# Patient Record
Sex: Female | Born: 1960 | Race: Black or African American | Hispanic: No | Marital: Single | Smoking: Current every day smoker
Health system: Southern US, Community
[De-identification: ages and names within clinical notes are randomized; demographics above are authoritative.]

## PROBLEM LIST (undated history)

## (undated) DIAGNOSIS — E119 Type 2 diabetes mellitus without complications: Secondary | ICD-10-CM

## (undated) HISTORY — DX: Type 2 diabetes mellitus without complications: E11.9

---

## 2004-02-29 ENCOUNTER — Ambulatory Visit: Payer: Self-pay | Admitting: Family Medicine

## 2004-03-15 ENCOUNTER — Ambulatory Visit: Payer: Self-pay | Admitting: Family Medicine

## 2004-04-15 ENCOUNTER — Ambulatory Visit: Payer: Self-pay | Admitting: Family Medicine

## 2004-05-16 ENCOUNTER — Ambulatory Visit: Payer: Self-pay | Admitting: Family Medicine

## 2006-11-12 ENCOUNTER — Ambulatory Visit: Payer: Self-pay | Admitting: Family Medicine

## 2006-11-14 ENCOUNTER — Ambulatory Visit: Payer: Self-pay | Admitting: Family Medicine

## 2012-09-15 ENCOUNTER — Emergency Department: Payer: Self-pay | Admitting: Emergency Medicine

## 2013-03-05 ENCOUNTER — Ambulatory Visit: Payer: Self-pay | Admitting: Family Medicine

## 2013-03-06 ENCOUNTER — Emergency Department: Payer: Self-pay | Admitting: Emergency Medicine

## 2013-06-15 ENCOUNTER — Ambulatory Visit: Payer: Self-pay | Admitting: Family Medicine

## 2013-09-29 ENCOUNTER — Observation Stay: Payer: Self-pay | Admitting: Internal Medicine

## 2013-09-29 LAB — CBC WITH DIFFERENTIAL/PLATELET
Basophil #: 0.1 10*3/uL (ref 0.0–0.1)
Basophil %: 0.9 %
EOS ABS: 0.1 10*3/uL (ref 0.0–0.7)
Eosinophil %: 0.9 %
HCT: 36.7 % (ref 35.0–47.0)
HGB: 12.1 g/dL (ref 12.0–16.0)
Lymphocyte #: 3.8 10*3/uL — ABNORMAL HIGH (ref 1.0–3.6)
Lymphocyte %: 26.7 %
MCH: 29 pg (ref 26.0–34.0)
MCHC: 32.9 g/dL (ref 32.0–36.0)
MCV: 88 fL (ref 80–100)
MONO ABS: 0.6 x10 3/mm (ref 0.2–0.9)
Monocyte %: 4.3 %
NEUTROS PCT: 67.2 %
Neutrophil #: 9.5 10*3/uL — ABNORMAL HIGH (ref 1.4–6.5)
Platelet: 237 10*3/uL (ref 150–440)
RBC: 4.16 10*6/uL (ref 3.80–5.20)
RDW: 14.2 % (ref 11.5–14.5)
WBC: 14.2 10*3/uL — ABNORMAL HIGH (ref 3.6–11.0)

## 2013-09-29 LAB — URINALYSIS, COMPLETE
Bilirubin,UR: NEGATIVE
Blood: NEGATIVE
GLUCOSE, UR: NEGATIVE mg/dL (ref 0–75)
Ketone: NEGATIVE
LEUKOCYTE ESTERASE: NEGATIVE
Nitrite: NEGATIVE
PROTEIN: NEGATIVE
Ph: 5 (ref 4.5–8.0)
SPECIFIC GRAVITY: 1.014 (ref 1.003–1.030)
Squamous Epithelial: 4

## 2013-09-29 LAB — LIPASE, BLOOD: Lipase: 160 U/L (ref 73–393)

## 2013-09-29 LAB — MAGNESIUM: Magnesium: 2 mg/dL

## 2013-09-29 LAB — COMPREHENSIVE METABOLIC PANEL
ALT: 51 U/L (ref 12–78)
ANION GAP: 6 — AB (ref 7–16)
Albumin: 3.6 g/dL (ref 3.4–5.0)
Alkaline Phosphatase: 87 U/L
BUN: 13 mg/dL (ref 7–18)
Bilirubin,Total: 0.1 mg/dL — ABNORMAL LOW (ref 0.2–1.0)
CALCIUM: 8.3 mg/dL — AB (ref 8.5–10.1)
Chloride: 107 mmol/L (ref 98–107)
Co2: 28 mmol/L (ref 21–32)
Creatinine: 0.48 mg/dL — ABNORMAL LOW (ref 0.60–1.30)
EGFR (African American): >60
Glucose: 140 mg/dL — ABNORMAL HIGH (ref 65–99)
Osmolality: 284 (ref 275–301)
Potassium: 2.9 mmol/L — ABNORMAL LOW (ref 3.5–5.1)
SGOT(AST): 36 U/L (ref 15–37)
Sodium: 141 mmol/L (ref 136–145)
Total Protein: 7.4 g/dL (ref 6.4–8.2)

## 2013-09-29 LAB — CK: CK, Total: 130 U/L

## 2013-09-29 LAB — TROPONIN I
Troponin-I: <0.02 ng/mL
Troponin-I: <0.02 ng/mL

## 2013-09-30 LAB — CBC WITH DIFFERENTIAL/PLATELET
BASOS ABS: 0.1 10*3/uL (ref 0.0–0.1)
Basophil %: 0.5 %
EOS ABS: 0 10*3/uL (ref 0.0–0.7)
Eosinophil %: 0.2 %
HCT: 34 % — AB (ref 35.0–47.0)
HGB: 11.2 g/dL — ABNORMAL LOW (ref 12.0–16.0)
LYMPHS PCT: 18.9 %
Lymphocyte #: 2.2 10*3/uL (ref 1.0–3.6)
MCH: 29 pg (ref 26.0–34.0)
MCHC: 32.9 g/dL (ref 32.0–36.0)
MCV: 88 fL (ref 80–100)
MONO ABS: 0.6 x10 3/mm (ref 0.2–0.9)
Monocyte %: 5.1 %
NEUTROS ABS: 9 10*3/uL — AB (ref 1.4–6.5)
Neutrophil %: 75.3 %
PLATELETS: 212 10*3/uL (ref 150–440)
RBC: 3.86 10*6/uL (ref 3.80–5.20)
RDW: 14.1 % (ref 11.5–14.5)
WBC: 11.9 10*3/uL — AB (ref 3.6–11.0)

## 2013-09-30 LAB — BASIC METABOLIC PANEL
Anion Gap: 6 — ABNORMAL LOW (ref 7–16)
BUN: 7 mg/dL (ref 7–18)
CALCIUM: 8.4 mg/dL — AB (ref 8.5–10.1)
CREATININE: 0.48 mg/dL — AB (ref 0.60–1.30)
Chloride: 106 mmol/L (ref 98–107)
Co2: 28 mmol/L (ref 21–32)
EGFR (Non-African Amer.): >60
Glucose: 112 mg/dL — ABNORMAL HIGH (ref 65–99)
OSMOLALITY: 278 (ref 275–301)
POTASSIUM: 3.5 mmol/L (ref 3.5–5.1)
Sodium: 140 mmol/L (ref 136–145)

## 2014-05-03 ENCOUNTER — Ambulatory Visit: Payer: Self-pay | Admitting: Physician Assistant

## 2014-06-13 IMAGING — CR SACRUM AND COCCYX - 2+ VIEW
1 series · 3 of 3 positions shown · non-contrast
Comparison: none

REASON FOR EXAM: fall, landed on rear, pain
COMMENTS:

PROCEDURE:     DXR - DXR SACRUM AND COCCYX  - September 15, 2012 [DATE]
RESULT:     AP lateral views of the sacrum reveal the bones to be adequately
mineralized. I do not see evidence of an acute fracture nor dislocation. The
SI joints are grossly normal. The coccyx appears intact.

[Series 1: t sacrum ap · 0.14mm/px · 3 of 3 slices shown]
[im 1/3]
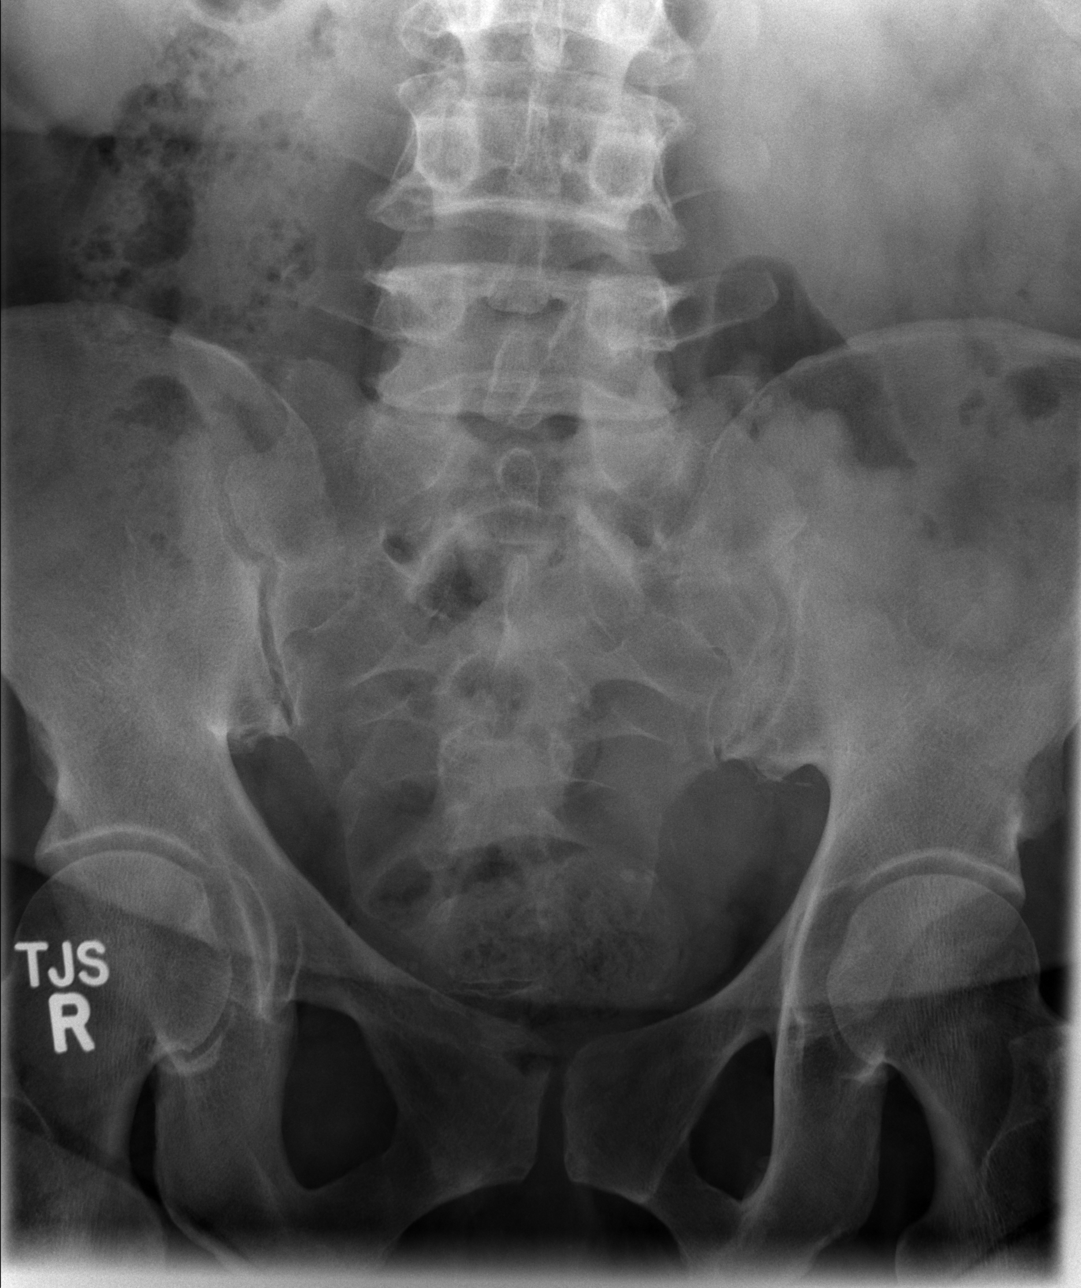
[im 2/3]
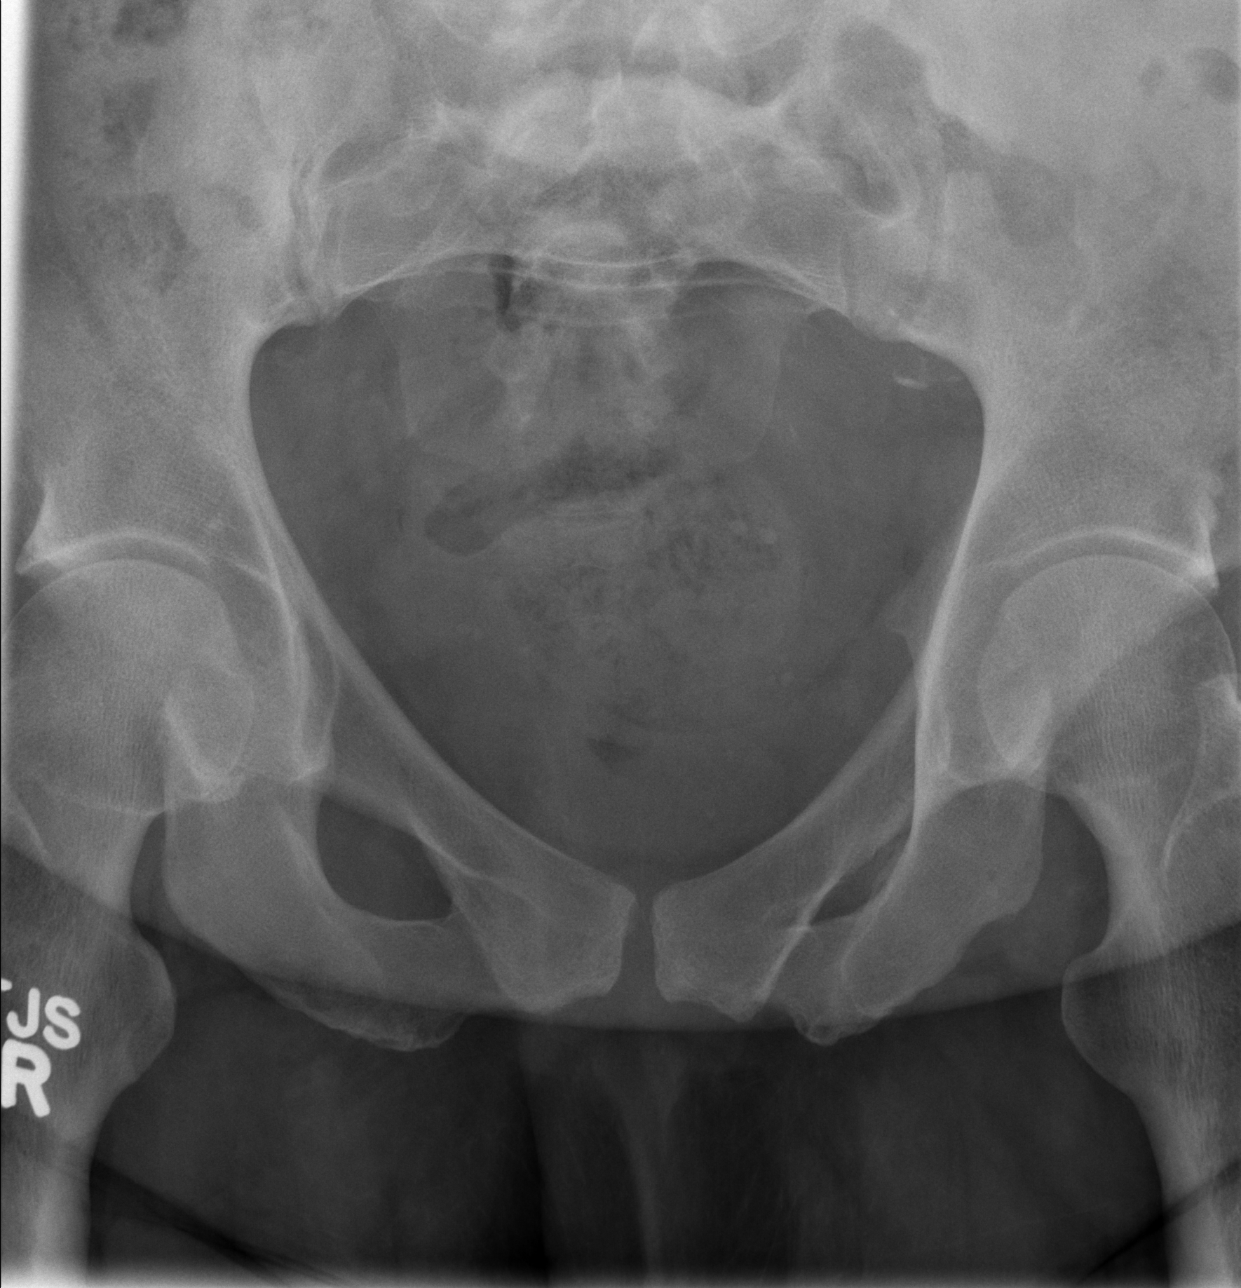
[im 3/3]
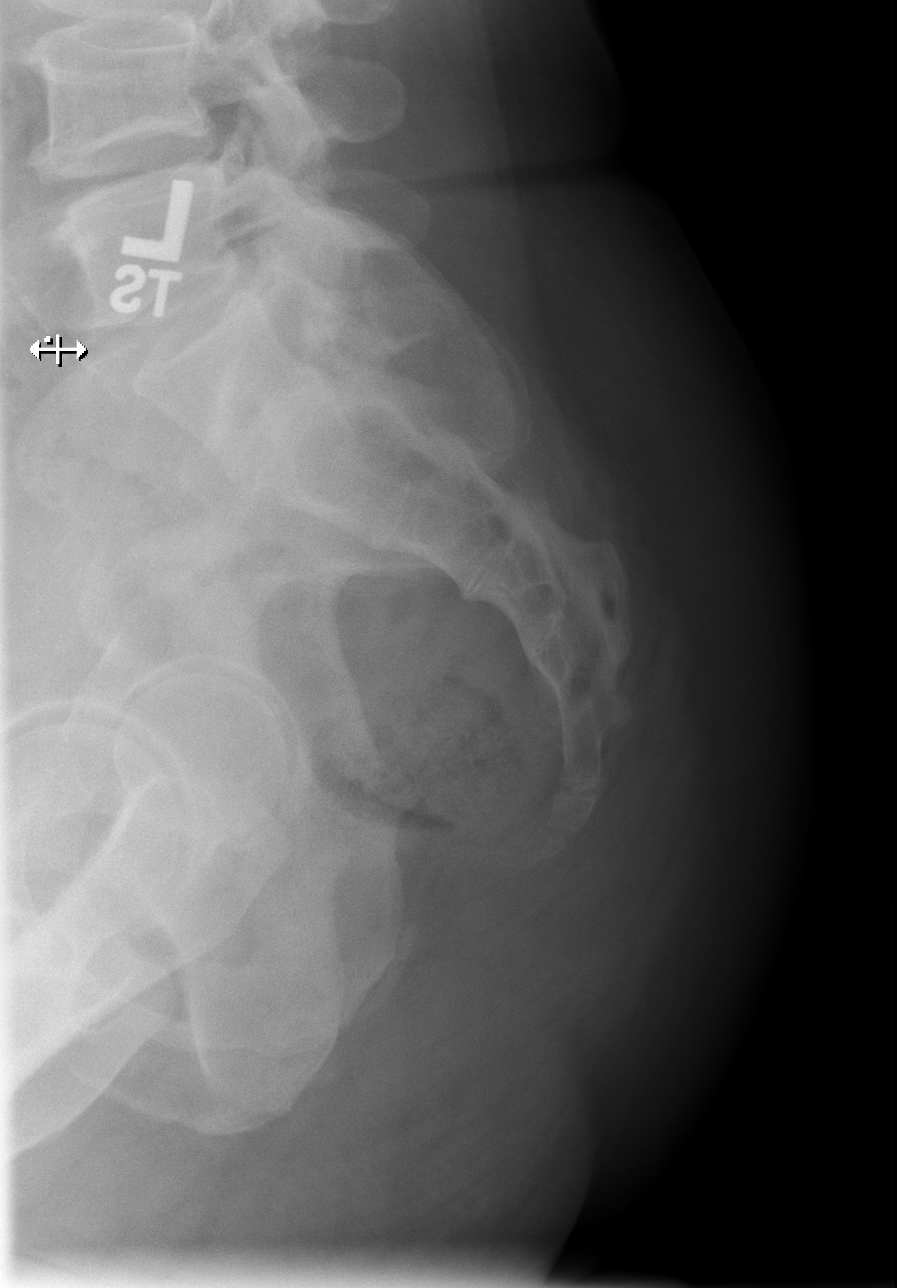

[3 of 3 positions shown; findings below may reference images not displayed]

IMPRESSION: I do not see acute bony abnormality of the sacrum or
coccyx. Followup CT scanning or MRI are available if the patient's symptoms
do not resolve in a fashion consistent with an uncomplicated sprain or
contusion.

[REDACTED]

## 2014-08-06 NOTE — Discharge Summary (Signed)
PATIENT NAME:  Kristina Kristina [name], Kristina Kristina [name] MR#:  [id number] DATE OF BIRTH:  [birth year]  DATE OF ADMISSION:  09/29/2013 DATE OF DISCHARGE:  09/30/2013  PRIMARY CARE PHYSICIAN: Nonlocal   DISCHARGE DIAGNOSES: 1. Acute gastritis.  2. Hypokalemia.  3. Hypertension.  4. Diabetes.  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   CODE STATUS: Full code.   HOME MEDICATION: Please refer to the medication reconciliation list.   DIET: Low sodium, low fat, low cholesterol, ADA diet.   ACTIVITY: As tolerated.   FOLLOWUP CARE: Follow up with PCP within 1-2 weeks. Follow up with PCP for incidental adrenal mass.   REASON FOR ADMISSION: Abdominal pain.   HOSPITAL COURSE: The patient is Kristina [name] 54 year old African American female with Kristina [name] history of hypertension, diabetes, hypothyroidism, presented to the ED with abdominal pain, nausea, vomiting but no diarrhea. For detailed history and physical examination, please refer to the admission note dictated by Dr. Clint GuyHower. Laboratory data on admission date showed sodium 141, potassium 2.9, chloride 107, bicarbonate 28, BUN 13, creatinine 0.48, glucose 140, magnesium 2, lipase 160, WBC 14.2. CAT scan of her abdomen and pelvis show adrenal mass of 2.3 cm x 2 cm.  1. Intractable nausea, vomiting, which is possibly due to acute gastritis. After admission, the patient was placed on clear liquids with Zofran and the Phenergan p.r.n. The patient's abdominal pain, nausea, vomiting have much improved.  She has no complaints this morning.   2. Hypokalemia. The patient was replaced with potassium level increased to 3.5 today and I gave another dose of potassium 30 mEq this morning.  3. Leukocytosis, possibly due to reaction to gastritis, decreased to 12 this morning.  4. Hypertension is under control.  5. The patient has no complaints. Vital signs stable. She is clinically stable and will be discharged to home today. I discussed the patient's discharge plan with the  patient, nurse, case manager.   TIME SPENT: About 35 minutes.    ____________________________ Shaune PollackQing Chen, MD qc:dd D: 09/30/2013 15:09:20 ET T: 09/30/2013 20:56:33 ET JOB#: [id number]  cc: Shaune PollackQing Chen, MD, <Dictator> Shaune PollackQING CHEN MD ELECTRONICALLY SIGNED 10/01/2013 17:12

## 2014-08-06 NOTE — H&P (Signed)
PATIENT NAME:  Kristina [name], Kristina [name] MR#:  [id number] DATE OF BIRTH:  19-Jul-1960  DATE OF ADMISSION:  09/29/2013  REFERRING PHYSICIAN: Margarita Grizzle.   PRIMARY CARE PHYSICIAN: [facility].   CHIEF COMPLAINT: Abdominal pain.   HISTORY OF PRESENT ILLNESS: A 54 year old African American female with past medical history of hypertension, diabetes, hypothyroidism, presenting with abdominal pain. Describes acute onset abdominal pain, epigastric in location. Described only as "pain." Intensity 8 out of 10, nonradiating. No worsening or relieving factors. Apparently, her symptoms started after eating "red hot wings which I'm not supposed to eat." She has associated nausea and vomiting. Had multiple bouts, approximately 5 to 6 thus far, of nonbloody, nonbilious emesis. Her symptoms persisted in the ER despite receiving does of Zofran and Phenergan, and she received some IV fluid hydration.   REVIEW OF SYSTEMS:   CONSTITUTIONAL: Denies fever, fatigue, weakness.  EYES: Denies blurred vision, double vision, eye pain.  EARS, NOSE, THROAT: Denies tinnitus, ear pain, hearing loss.  RESPIRATORY: Denies cough, wheeze, shortness of breath.  CARDIOVASCULAR: Denies chest pain, palpitations, edema.  GASTROINTESTINAL: Positive for nausea, vomiting, abdominal pain as described above. Denies any diarrhea, hematemesis or melena.  GENITOURINARY: Denies dysuria or hematuria.  ENDOCRINE: Denies nocturia or thyroid problems.  HEMATOLOGY AND LYMPHATIC: Denies easy bruising, bleeding.  SKIN: Denies rashes or lesions.  MUSCULOSKELETAL: Denies pain in neck, back, shoulder, knees, hips or arthritic symptoms.  NEUROLOGIC: Denies paralysis, paresthesias.  PSYCHIATRIC: Denies anxiety or depressive symptoms.   Otherwise, full review of systems performed by me is negative.   PAST MEDICAL HISTORY: Hypertension, diabetes, as well as hypothyroidism.   SOCIAL HISTORY: Positive for every day tobacco use. Occasional alcohol usage.  Denies any drug usage.   FAMILY HISTORY: Positive for diabetes.   ALLERGIES: No known drug allergies.   HOME MEDICATIONS: Include meloxicam 7.5 mg p.o. b.i.d. as needed for pain, benazepril 40 mg p.o. daily, citalopram 10 mg p.o. daily, metformin 500 mg 3 tablets extended release p.o. daily, cetirizine 10 mg p.o. daily, simvastatin 20 mg p.o. at bedtime, Norvasc 5 mg p.o. daily, hydrochlorothiazide 25 mg p.o. daily, Prilosec 20 mg p.o. daily.   PHYSICAL EXAMINATION:  VITAL SIGNS: Temperature 97.7, heart rate 66, respirations 18, blood pressure 173/77, saturating 96% on room air. Weight 104.3 kg, BMI 36.1.  GENERAL: Well-nourished, well-developed, African American female, currently in no acute distress.  HEAD: Normocephalic, atraumatic.  EYES: Pupils equal, round and reactive to light. Extraocular muscles intact. No scleral icterus.  MOUTH: Moist mucosal membranes. Dentition intact. No abscess noted.  EARS, NOSE, THROAT: Clear without exudates. No external lesions.  NECK: Supple. No thyromegaly. No nodules. No JVD.  PULMONARY: Clear to auscultation bilaterally without wheezes, rubs or rhonchi. No use of accessory muscles. Good respiratory effort.  CHEST: Nontender to palpation.  CARDIOVASCULAR: S1, S2, regular rate and rhythm. No murmurs, rubs or gallops. Trace edema to ankles bilaterally. Peripheral pulses 2+ bilaterally.  GASTROINTESTINAL: Soft, obese, minimal tenderness to epigastric/periumbilical region without rebound or guarding or motion tenderness. Positive bowel sounds. No hepatosplenomegaly.  MUSCULOSKELETAL: No swelling, clubbing or edema other than described above. Range of motion full in all extremities.  NEUROLOGIC: Cranial nerves II through XII intact. No gross focal neurological deficits. Sensation intact. Reflexes intact.  SKIN: No ulceration, lesions, rash or cyanosis. Skin warm, dry. Turgor intact.  PSYCHIATRIC: Mood and affect within normal limits. The patient is awake,  alert, oriented x 3. Insight and judgment intact.   LABORATORY DATA: Sodium 141, potassium 2.9, chloride 107,  bicarb 28, BUN 13, creatinine 0.48, glucose 140. Magnesium of 2. Lipase 160. LFTs within normal limits. Troponin I less than 0.02. WBC 14.2, hemoglobin 12.1, platelets of 237. Urinalysis negative for evidence of infection. CT abdomen performed revealing a 2.3 x 2 cm mild enhancing mass probably extending from anterior/inferior left adrenal gland. No acute abdominal findings. EKG performed. Normal sinus rhythm; however, there are anterolateral T inversions without prior EKGs.   ASSESSMENT AND PLAN: A 54 year old [place] female with a history of hypertension, diabetes, presenting with abdominal pain after eating red hot wings, followed by intractable nausea and vomiting.  1. Intractable nausea and vomiting: Provide supportive care including Zofran and/or Phenergan if required for nausea. Intravenous fluid hydration. Once again, no acute findings on abdominal or pelvic CT.  2. Hypokalemia: Replace potassium to goal of 4 to 5.  3. Diabetes type 2: Will hold p.o. agents. Add insulin sliding scale with q.6 hour Accu-Cheks.  4. Leukocytosis: No evidence of infection at this time. No need for antibiotics.  5. Abnormal EKG with unknown baseline: Will trend cardiac enzymes x 3.  6. Hypertension: Hold hydrochlorothiazide. Continue her other medications including ACE inhibitors and Norvasc.  7. 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  8. Venous thromboembolism prophylaxis with heparin subcutaneous.   The patient is FULL CODE.   TIME SPENT: 45 minutes.   ____________________________ Cletis Athensavid K. Hower, MD dkh:gb D: 09/29/2013 21:42:34 ET T: 09/29/2013 22:20:53 ET JOB#: [id number]  cc: Cletis Athensavid K. Hower, MD, <Dictator> DAVID Synetta ShadowK HOWER MD ELECTRONICALLY SIGNED 09/30/2013 20:25

## 2017-09-29 ENCOUNTER — Other Ambulatory Visit: Payer: Self-pay | Admitting: Family Medicine

## 2017-09-29 DIAGNOSIS — Z1231 Encounter for screening mammogram for malignant neoplasm of breast: Secondary | ICD-10-CM

## 2018-04-28 ENCOUNTER — Other Ambulatory Visit: Payer: Self-pay | Admitting: Family Medicine

## 2018-04-28 DIAGNOSIS — Z1231 Encounter for screening mammogram for malignant neoplasm of breast: Secondary | ICD-10-CM

## 2022-05-06 ENCOUNTER — Other Ambulatory Visit: Payer: Self-pay | Admitting: Family Medicine

## 2022-05-06 DIAGNOSIS — Z1231 Encounter for screening mammogram for malignant neoplasm of breast: Secondary | ICD-10-CM

## 2022-05-24 ENCOUNTER — Ambulatory Visit: Payer: Medicaid Other | Admitting: Podiatry

## 2022-05-24 ENCOUNTER — Encounter: Payer: Self-pay | Admitting: Podiatry

## 2022-05-24 VITALS — BP 143/67 | HR 67

## 2022-05-24 DIAGNOSIS — M79675 Pain in left toe(s): Secondary | ICD-10-CM | POA: Diagnosis not present

## 2022-05-24 DIAGNOSIS — B351 Tinea unguium: Secondary | ICD-10-CM

## 2022-05-24 DIAGNOSIS — M79674 Pain in right toe(s): Secondary | ICD-10-CM | POA: Diagnosis not present

## 2022-05-24 NOTE — Progress Notes (Signed)
  Subjective:  Patient ID: Kristina [name], female    DOB: [birth year],  MRN: [id number]  Chief Complaint  Patient presents with   Nail Problem    "Clip my toenails.  They're painful.  I'm Diabetic. They're thick and hard to cut.  It's been about 2 years since I had it done.  The last time I had it done, the man cut my meat."   62 y.o. female returns for the above complaint.  Patient presents with thickened elongated dystrophic toenails x 10 she states painful to touch is progressive gotten worse she is a diabetic she would like to me to debride down she is not able to do it herself.  Objective:   Vitals:   05/24/22 1006  BP: (!) 143/67  Pulse: 67   Podiatric Exam: Vascular: dorsalis pedis and posterior tibial pulses are palpable bilateral. Capillary return is immediate. Temperature gradient is WNL. Skin turgor WNL  Sensorium: Normal Semmes Weinstein monofilament test. Normal tactile sensation bilaterally. Nail Exam: Pt has thick disfigured discolored nails with subungual debris noted bilateral entire nail hallux through fifth toenails.  Pain on palpation to the nails. Ulcer Exam: There is no evidence of ulcer or pre-ulcerative changes or infection. Orthopedic Exam: Muscle tone and strength are WNL. No limitations in general ROM. No crepitus or effusions noted.  Skin: No Porokeratosis. No infection or ulcers    Assessment & Plan:   1. Pain due to onychomycosis of toenails of both feet     Patient was evaluated and treated and all questions answered.  Onychomycosis with pain  -Nails palliatively debrided as below. -Educated on self-care  Procedure: Nail Debridement Rationale: pain  Type of Debridement: manual, sharp debridement. Instrumentation: Nail nipper, rotary burr. Number of Nails: 10  Procedures and Treatment: Consent by patient was obtained for treatment procedures. The patient understood the discussion of treatment and procedures well. All questions were answered  thoroughly reviewed. Debridement of mycotic and hypertrophic toenails, 1 through 5 bilateral and clearing of subungual debris. No ulceration, no infection noted.  Return Visit-Office Procedure: Patient instructed to return to the office for a follow up visit 3 months for continued evaluation and treatment.  Boneta Lucks, DPM    No follow-ups on file.

## 2022-06-28 ENCOUNTER — Ambulatory Visit
Admission: RE | Admit: 2022-06-28 | Discharge: 2022-06-28 | Disposition: A | Discharge status: Home / Self Care | Payer: Medicaid Other | Source: Ambulatory Visit | Attending: Family Medicine | Admitting: Family Medicine

## 2022-06-28 DIAGNOSIS — Z1231 Encounter for screening mammogram for malignant neoplasm of breast: Secondary | ICD-10-CM | POA: Diagnosis present

## 2022-09-05 ENCOUNTER — Encounter: Payer: Self-pay | Admitting: Podiatry

## 2022-09-05 ENCOUNTER — Ambulatory Visit: Payer: Medicaid Other | Admitting: Podiatry

## 2022-09-05 VITALS — BP 122/64

## 2022-09-05 DIAGNOSIS — M2011 Hallux valgus (acquired), right foot: Secondary | ICD-10-CM

## 2022-09-05 DIAGNOSIS — E119 Type 2 diabetes mellitus without complications: Secondary | ICD-10-CM | POA: Diagnosis not present

## 2022-09-05 DIAGNOSIS — B351 Tinea unguium: Secondary | ICD-10-CM

## 2022-09-05 DIAGNOSIS — E1142 Type 2 diabetes mellitus with diabetic polyneuropathy: Secondary | ICD-10-CM | POA: Diagnosis not present

## 2022-09-05 DIAGNOSIS — M2141 Flat foot [pes planus] (acquired), right foot: Secondary | ICD-10-CM

## 2022-09-05 DIAGNOSIS — M79674 Pain in right toe(s): Secondary | ICD-10-CM

## 2022-09-05 DIAGNOSIS — L84 Corns and callosities: Secondary | ICD-10-CM

## 2022-09-05 DIAGNOSIS — M2012 Hallux valgus (acquired), left foot: Secondary | ICD-10-CM

## 2022-09-05 DIAGNOSIS — M79675 Pain in left toe(s): Secondary | ICD-10-CM

## 2022-09-05 DIAGNOSIS — M2142 Flat foot [pes planus] (acquired), left foot: Secondary | ICD-10-CM

## 2022-09-05 NOTE — Progress Notes (Signed)
ANNUAL DIABETIC FOOT EXAM  Subjective: Kristina [name] presents today annual diabetic foot exam. She is accompanied by her first cousin on today's visit. Patient is nervous about experiencing pain during her visit today.  States left great toenail is thick and tender. She has h/o of attempted trimming of nails and states all of her toes bled when she cut them, so she has refrained from doing it again. Per chart review, she has h/o toe ulcer of right 2nd digit. Chief Complaint  Patient presents with   Nail Problem    RFC,Referring Provider Drew,Charles, MD,LOV:3-4 months ago,B/S: no finger sticks,A1C:      Chart review reveals h/o toe ulcer right 2nd toe.  Patient denies any numbness, tingling, burning, or pins/needle sensation in feet.  Risk factors: diabetes, history of foot/leg ulcer, HTN, hyperlipidemia, current tobacco user.  Mickel Fuchs, MD is patient's PCP.  Past Medical History:  Diagnosis Date   Diabetes [facility])    Patient Active Problem List   Diagnosis Date Noted   Diabetes (HCC) 09/05/2022   No past surgical history on file. Current Outpatient Medications on File Prior to Visit  Medication Sig Dispense Refill   atorvastatin (LIPITOR) 10 MG tablet Take 10 mg by mouth daily.     benazepril (LOTENSIN) 40 MG tablet Take 40 mg by mouth daily.     fluticasone (FLONASE) 50 MCG/ACT nasal spray Place 1 spray into both nostrils daily.     hydrochlorothiazide (HYDRODIURIL) 25 MG tablet Take 25 mg by mouth daily.     levothyroxine (SYNTHROID) 75 MCG tablet Take 75 mcg by mouth daily.     metFORMIN (GLUCOPHAGE-XR) 500 MG 24 hr tablet Take 1,500 mg by mouth daily.     metoprolol succinate (TOPROL-XL) 25 MG 24 hr tablet Take 25 mg by mouth daily.     nicotine polacrilex (NICORETTE) 2 MG gum Take 2 mg by mouth as needed.     simvastatin (ZOCOR) 10 MG tablet Take 10 mg by mouth at bedtime.     No current facility-administered medications on file prior to visit.    No Known  Allergies Social History   Occupational History   Not on file  Tobacco Use   Smoking status: Every Day    Packs/day: .5    Types: Cigarettes   Smokeless tobacco: Former    Types: Snuff  Substance and Sexual Activity   Alcohol use: Yes    Comment: once in a while   Drug use: Never   Sexual activity: Not on file   No family history on file.  There is no immunization history on file for this patient.   Review of Systems: Negative except as noted in the HPI.   Objective: Vitals:   09/05/22 1125  BP: 122/64    Kristina [name] is a pleasant 62 y.o. female in NAD. AAO X 3.  Vascular Examination: Capillary refill time immediate b/l. Vascular status intact b/l with palpable pedal pulses. Pedal hair present b/l. No pain with calf compression b/l. Skin temperature gradient WNL b/l. No cyanosis or clubbing b/l. No ischemia or gangrene noted b/l.   Neurological Examination: Sensation decreased b/l with 10 gram monofilament. Vibratory sensation intact b/l.   Dermatological Examination: Pedal skin with normal turgor, texture and tone b/l.  No open wounds. No interdigital macerations.   Toenails 1-5 b/l thick, discolored, elongated with subungual debris and pain on dorsal palpation.   Hyperkeratotic lesion(s) submet head 1 right foot and 1st metatarsal head left lower  extremity.  No erythema, no edema, no drainage, no fluctuance. Preulcerative lesion noted submet head 1 left foot. There is visible subdermal hemorrhage. There is no surrounding erythema, no edema, no drainage, no odor, no fluctuance.  Musculoskeletal Examination: Normal muscle strength 5/5 to all lower extremity muscle groups bilaterally. Pes planus deformity noted bilateral LE.Marland Kitchen No pain, crepitus or joint limitation noted with ROM b/l LE.  Patient ambulates independently without assistive aids.  Radiographs: None  ADA Risk Categorization: Low Risk :  Patient has all of the following: Intact protective  sensation No prior foot ulcer  No severe deformity Pedal pulses present  Assessment: 1. Pain due to onychomycosis of toenails of both feet   2. Pre-ulcerative calluses   3. Hallux valgus, acquired, bilateral   4. Pes planus of both feet   5. Diabetic peripheral neuropathy associated with type 2 diabetes mellitus (HCC)   6. Encounter for diabetic foot exam (HCC)     Plan: Orders Placed This Encounter  Procedures   For Home Use Only DME Diabetic Shoe    Dispense one pair of extra depth shoes with 3 pair total contact insoles. Offload calluses plantarly b/l submet head 1.   FOR HOME USE ONLY DME DIABETIC SHOE  -Patient was evaluated and treated. All patient's and/or POA's questions/concerns answered on today's visit. -Patient's family member present. All questions/concerns addressed on today's visit. -Diabetic foot examination performed today. -Continue diabetic foot care principles: inspect feet daily, monitor glucose as recommended by PCP and/or Endocrinologist, and follow prescribed diet per PCP, Endocrinologist and/or dietician. -Patient to continue soft, supportive shoe gear daily. -Toenails 1-5 b/l were debrided in length and girth with sterile nail nippers and dremel without iatrogenic bleeding.  -Callus(es) submet head 1 right foot and 1st metatarsal head left lower extremity pared utilizing sterile scalpel blade without complication or incident. Total number debrided =2. -Preulcerative lesion pared submet head 1 left foot utilizing sterile scalpel blade. Total number pared=1. -Patient/POA to call should there be question/concern in the interim. Return in about 4 months (around 01/06/2023).  Freddie Breech, DPM

## 2022-09-09 ENCOUNTER — Encounter: Payer: Self-pay | Admitting: Podiatry

## 2022-11-01 ENCOUNTER — Ambulatory Visit (INDEPENDENT_AMBULATORY_CARE_PROVIDER_SITE_OTHER): Payer: Medicaid Other | Admitting: Podiatry

## 2022-11-01 DIAGNOSIS — M2141 Flat foot [pes planus] (acquired), right foot: Secondary | ICD-10-CM

## 2022-11-01 DIAGNOSIS — E1142 Type 2 diabetes mellitus with diabetic polyneuropathy: Secondary | ICD-10-CM

## 2022-11-01 DIAGNOSIS — M2011 Hallux valgus (acquired), right foot: Secondary | ICD-10-CM

## 2022-11-01 DIAGNOSIS — M2012 Hallux valgus (acquired), left foot: Secondary | ICD-10-CM

## 2022-11-01 DIAGNOSIS — M2142 Flat foot [pes planus] (acquired), left foot: Secondary | ICD-10-CM

## 2022-11-01 DIAGNOSIS — L84 Corns and callosities: Secondary | ICD-10-CM

## 2022-11-01 NOTE — Progress Notes (Unsigned)
Patient presents today to be measured for  diabetic shoes and insoles.  Patient was measured for  1 pair of diabetic shoes and 3 pairs of foam casted diabetic insoles. Ht 5'6 Wt 195 Shoe size 11w ordered 11xw Shoe type a300w  Treating physician  dr worth   Financial forms signed

## 2022-11-25 ENCOUNTER — Other Ambulatory Visit: Payer: Self-pay | Admitting: Student

## 2022-11-25 DIAGNOSIS — G8929 Other chronic pain: Secondary | ICD-10-CM

## 2022-11-25 DIAGNOSIS — M7582 Other shoulder lesions, left shoulder: Secondary | ICD-10-CM

## 2022-11-25 DIAGNOSIS — M7989 Other specified soft tissue disorders: Secondary | ICD-10-CM

## 2022-12-10 ENCOUNTER — Encounter: Payer: Self-pay | Admitting: Student

## 2022-12-12 ENCOUNTER — Ambulatory Visit: Payer: Medicaid Other | Admitting: Podiatry

## 2022-12-12 ENCOUNTER — Encounter: Payer: Self-pay | Admitting: Podiatry

## 2022-12-12 DIAGNOSIS — E1142 Type 2 diabetes mellitus with diabetic polyneuropathy: Secondary | ICD-10-CM | POA: Diagnosis not present

## 2022-12-12 DIAGNOSIS — M79675 Pain in left toe(s): Secondary | ICD-10-CM

## 2022-12-12 DIAGNOSIS — B351 Tinea unguium: Secondary | ICD-10-CM | POA: Diagnosis not present

## 2022-12-12 DIAGNOSIS — L84 Corns and callosities: Secondary | ICD-10-CM

## 2022-12-12 DIAGNOSIS — M79674 Pain in right toe(s): Secondary | ICD-10-CM | POA: Diagnosis not present

## 2022-12-13 ENCOUNTER — Other Ambulatory Visit: Payer: Self-pay | Admitting: Student

## 2022-12-13 ENCOUNTER — Ambulatory Visit: Admission: RE | Admit: 2022-12-13 | Payer: Medicaid Other | Source: Ambulatory Visit

## 2022-12-13 DIAGNOSIS — M7582 Other shoulder lesions, left shoulder: Secondary | ICD-10-CM

## 2022-12-13 DIAGNOSIS — M7989 Other specified soft tissue disorders: Secondary | ICD-10-CM

## 2022-12-13 DIAGNOSIS — G8929 Other chronic pain: Secondary | ICD-10-CM

## 2022-12-15 NOTE — Progress Notes (Signed)
  Subjective:  Patient ID: Kristina [name], female    DOB: [birth year],  MRN: [id number]  Kristina [name] presents to clinic today for at risk foot care with history of diabetic neuropathy and preulcerative lesion(s) left foot, callus(es) of both feet which are aggravated when weightbearing with and without shoegear. Pain is relieved with periodic professional debridement.  Chief Complaint  Patient presents with   Nail Problem    DFC,Referring Provider Mickel Fuchs, MD,LOV:08/24,A1C:unknown,BS:unknown   New problem(s): None.   PCP is Wroth, Sydnee Cabal, MD.  No Known Allergies  Review of Systems: Negative except as noted in the HPI.  Objective: No changes noted in today's physical examination. There were no vitals filed for this visit. Kristina [name] is a pleasant 61 y.o. female in NAD. AAO x 3.  Vascular Examination: Capillary refill time immediate b/l. Vascular status intact b/l with palpable pedal pulses. Pedal hair present b/l. No pain with calf compression b/l. Skin temperature gradient WNL b/l. No cyanosis or clubbing b/l. No ischemia or gangrene noted b/l.   Neurological Examination: Sensation decreased b/l with 10 gram monofilament. Vibratory sensation intact b/l.   Dermatological Examination: Pedal skin with normal turgor, texture and tone b/l.  No open wounds. No interdigital macerations.   Toenails 1-5 b/l thick, discolored, elongated with subungual debris and pain on dorsal palpation.   Hyperkeratotic lesion(s) submet head 1 right foot and 1st metatarsal head left lower extremity.  No erythema, no edema, no drainage, no fluctuance.   Preulcerative lesion noted submet head 1 left foot. There is visible subdermal hemorrhage. There is no surrounding erythema, no edema, no drainage, no odor, no fluctuance.  Musculoskeletal Examination: Normal muscle strength 5/5 to all lower extremity muscle groups bilaterally. HAV with bunion deformity b/l. Pes planus  deformity noted bilateral LE.Marland Kitchen No pain, crepitus or joint limitation noted with ROM b/l LE.  Patient ambulates independently without assistive aids.  Assessment/Plan: 1. Pain due to onychomycosis of toenails of both feet   2. Pre-ulcerative calluses   3. Diabetic peripheral neuropathy associated with type 2 diabetes mellitus (HCC)     -Consent given for treatment as described below: -Examined patient. -Continue foot and shoe inspections daily. Monitor blood glucose per PCP/Endocrinologist's recommendations. -Patient to continue soft, supportive shoe gear daily. -Toenails 1-5 b/l were debrided in length and girth with sterile nail nippers and dremel without iatrogenic bleeding.  -Callus(es) submet head 1 right foot and 1st metatarsal head left foot pared utilizing sharp debridement with sterile blade without complication or incident. Total number debrided =2. -Preulcerative lesion pared submet head 1 left foot utilizing sterile scalpel blade. Total number pared=1. -Patient/POA to call should there be question/concern in the interim.   Return in about 3 months (around 03/14/2023).  Freddie Breech, DPM

## 2023-01-25 LAB — GLUCOSE, POCT (MANUAL RESULT ENTRY): Glucose Fasting, POC: 114 mg/dL — AB (ref 70–99)

## 2023-03-04 ENCOUNTER — Encounter: Payer: Self-pay | Admitting: *Deleted

## 2023-03-04 NOTE — Progress Notes (Signed)
Pt attended 01/25/23 screening event where her b/p was 161/74 and her blood sugar was 114. At the event, pt shared her PCP was Dr. Pilar Grammes at the [facility] clinic and she did not identify any SDOH insecurities. During the event f/u call today, pt confirmed she sees Dr. Butler Denmark for her care "at the [facility] clinic" and pt stated she does take her own b/p at home and it was "in the 70's but did not remember the top number" so stated she would check her b/p again today. Health equity team member offered to send pt a pocket b/p log to track her b/p and report back to Dr. Butler Denmark if the top number was higher than 139 on a continuing basis. Caller discussed dangers for high blood pressure on heart and blood vessels. Pt stated "well, I don't stress any more." Pt agreed to monitor b/p and report to Dr. Butler Denmark and log mailed to pt with her permission. Chart review indicates pt;s PCP made referral for her to ortho as of 10/29/22,indicating pt's ongoing contact with PCP office although PCP encounters are not visible in [facility]. Pt stated she could get her medications as needed and had no current SDOH needs. No additional health equity team support indicated at this time.

## 2023-04-24 NOTE — Congregational Nurse Program (Signed)
  Dept: [phone]   Congregational Nurse Program Note  Date of Encounter: 04/24/2023  Clinic visit to check blood pressure, BP 166/70, pulse 74 and irregular, O2 Sat 97%.  States she has not taken BP meds today because she went to a funeral.  Educated regarding normal blood pressure levels an the need to be consistent with taking all prescribed medications. Past Medical History: Past Medical History:  Diagnosis Date   Diabetes [facility])     Encounter Details:  Community Questionnaire - 04/24/23 1536       Questionnaire   Ask client: Do you give verbal consent for me to treat you today? Yes    Student Assistance N/A    Location Patient Served  N/A    Encounter Setting CN site    Population Status Unknown   Has own apartment at [facility]    Insurance/Financial Assistance Referral N/A    Medication N/A    Medical Provider Yes    Screening Referrals Made N/A    Medical Referrals Made N/A    Medical Appointment Completed N/A    CNP Interventions Advocate/Support;Counsel;Educate    Screenings CN Performed Blood Pressure    ED Visit Averted N/A    Life-Saving Intervention Made N/A

## 2023-05-22 NOTE — Congregational Nurse Program (Signed)
  Dept: [phone]   Congregational Nurse Program Note  Date of Encounter: 05/22/2023  Clinic visit to check blood pressure, BP 140/62, pulse 58 and regular, O2 Sat 97%.  States that she is taking all her medications as prescribed.  Educated regarding normal blood pressure levels and on what makes up the blood pressure numbers. Discussed relationship of smoking cigarettes to heart disease and complications of diabetes.  Is checking blood glucose in AM and sometimes in the evening, was 123 this AM and 72 last night.  Educated regarding normal blood glucose levels. Past Medical History: Past Medical History:  Diagnosis Date   Diabetes [facility])     Encounter Details:  Community Questionnaire - 05/22/23 1510       Questionnaire   Ask client: Do you give verbal consent for me to treat you today? Yes    Student Assistance N/A    Location Patient Served  N/A    Encounter Setting CN site    Population Status Unknown   Has own apartment at [facility]    Insurance/Financial Assistance Referral N/A    Medication N/A    Medical Provider Yes    Screening Referrals Made N/A    Medical Referrals Made N/A    Medical Appointment Completed N/A    CNP Interventions Advocate/Support;Counsel;Educate    Screenings CN Performed Blood Pressure    ED Visit Averted N/A    Life-Saving Intervention Made N/A

## 2023-06-12 ENCOUNTER — Ambulatory Visit: Payer: Medicaid Other | Admitting: Podiatry

## 2023-06-12 DIAGNOSIS — M79675 Pain in left toe(s): Secondary | ICD-10-CM | POA: Diagnosis not present

## 2023-06-12 DIAGNOSIS — M79674 Pain in right toe(s): Secondary | ICD-10-CM | POA: Diagnosis not present

## 2023-06-12 DIAGNOSIS — B351 Tinea unguium: Secondary | ICD-10-CM | POA: Diagnosis not present

## 2023-06-12 DIAGNOSIS — E1142 Type 2 diabetes mellitus with diabetic polyneuropathy: Secondary | ICD-10-CM

## 2023-06-15 ENCOUNTER — Encounter: Payer: Self-pay | Admitting: Podiatry

## 2023-06-15 NOTE — Progress Notes (Signed)
  Subjective:  Patient ID: Kristina [name], female    DOB: 1961/03/30,  MRN: [id number]  Kristina [name] presents to clinic today for at risk foot care with history of diabetic neuropathy and preulcerative lesion(s) b/l feet and painful mycotic toenails that limit ambulation. Painful toenails interfere with ambulation. Aggravating factors include wearing enclosed shoe gear. Pain is relieved with periodic professional debridement. Painful preulcerative lesion(s) is/are aggravated when weightbearing with and without shoegear. Pain is relieved with periodic professional debridement.   New problem(s): None.   PCP is Wroth, Sydnee Cabal, MD.  No Known Allergies  Review of Systems: Negative except as noted in the HPI.  Objective: No changes noted in today's physical examination. There were no vitals filed for this visit. Kristina [name] is a pleasant 63 y.o. female WD, WN in NAD. AAO x 3.  Vascular Examination: Capillary refill time immediate b/l. Vascular status intact b/l with palpable pedal pulses. Pedal hair present b/l. No pain with calf compression b/l. Skin temperature gradient WNL b/l. No cyanosis or clubbing b/l. No ischemia or gangrene noted b/l.   Neurological Examination: Sensation decreased b/l with 10 gram monofilament. Vibratory sensation intact b/l.   Dermatological Examination: Pedal skin with normal turgor, texture and tone b/l.  No open wounds. No interdigital macerations.   Toenails 1-5 b/l thick, discolored, elongated with subungual debris and pain on dorsal palpation.   Hyperkeratotic lesion(s) submet head 1 right foot and 1st metatarsal head left lower extremity.  No erythema, no edema, no drainage, no fluctuance.   Preulcerative lesion noted submet head 1 left foot. There is visible subdermal hemorrhage. There is no surrounding erythema, no edema, no drainage, no odor, no fluctuance.  Musculoskeletal Examination: Normal muscle strength 5/5 to all lower  extremity muscle groups bilaterally. HAV with bunion deformity b/l. Pes planus deformity noted bilateral LE. No pain, crepitus or joint limitation noted with ROM b/l LE.  Patient ambulates independently without assistive aids.  Assessment/Plan: 1. Pain due to onychomycosis of toenails of both feet   2. Diabetic peripheral neuropathy associated with type 2 diabetes mellitus (HCC)     -Caregiver/provider present with patient on today's visit. -Continue foot and shoe inspections daily. Monitor blood glucose per PCP/Endocrinologist's recommendations. -Patient to continue soft, supportive shoe gear daily. -Mycotic toenails 1-5 bilaterally were debrided in length and girth with sterile nail nippers and dremel without incident. -As a courtesy, preulcerative lesion(s) bilateral great toes and 1st metatarsal head b/l feet pared without complication or incident. Total number pared=4. -Patient/POA to call should there be question/concern in the interim.   Return in about 3 months (around 09/09/2023).  Freddie Breech, DPM      [place] LOCATION: [address], [place] [address]                   Office [phone]   [facility] LOCATION: [address] [place], [place] [address] Office [phone]

## 2023-06-19 NOTE — Congregational Nurse Program (Signed)
  Dept: [phone]   Congregational Nurse Program Note  Date of Encounter: 06/19/2023  Clinic visit to check blood pressure, BP 142/60, pulse 70 and regular, O2 Sat 95%.  Educated regarding difference between systolic and diastolic blood pressure and the target levels for blood pressure.  States she is taking her blood pressure medicines as prescribed. Past Medical History: Past Medical History:  Diagnosis Date   Diabetes [facility])     Encounter Details:  Community Questionnaire - 06/19/23 1645       Questionnaire   Ask client: Do you give verbal consent for me to treat you today? Yes    Student Assistance N/A    Location Patient Served  N/A    Encounter Setting CN site    Population Status Unknown   Has own apartment at [facility]    Insurance/Financial Assistance Referral N/A    Medication N/A    Medical Provider Yes    Screening Referrals Made N/A    Medical Referrals Made N/A    Medical Appointment Completed N/A    CNP Interventions Advocate/Support;Counsel;Educate    Screenings CN Performed Blood Pressure    ED Visit Averted N/A    Life-Saving Intervention Made N/A

## 2023-08-15 ENCOUNTER — Ambulatory Visit: Payer: Medicaid Other

## 2023-08-18 NOTE — Progress Notes (Signed)
 Patient presents to the office today for diabetic shoe and insole measuring.  Patient was measured with brannock device to determine size and width for 1 pair of extra depth shoes and foam casted for 3 pair of insoles.   Documentation of medical necessity will be sent to patient's treating diabetic doctor to verify and sign.   Patient's diabetic provider: Andy Bannister   Shoes and insoles will be ordered at that time and patient will be notified for an appointment for fitting when they arrive.   Shoe size (per patient): 11WD Brannock measurement: 11 Shoe choice:   A300W Shoe size ordered: 11WD  THIS IS A NTB ORDER SAFE STEP NEVER SENT SHOES AFTER HAVING PPW AND THEN CANCELLED

## 2023-08-22 ENCOUNTER — Telehealth: Payer: Self-pay

## 2023-08-22 NOTE — Telephone Encounter (Signed)
 DM shoes put in box to be sent to [facility] on 08/22/23

## 2023-08-30 NOTE — Congregational Nurse Program (Signed)
  Dept: [phone]   Congregational Nurse Program Note  Date of Encounter: 08/21/2023  Clinic visit to check blood pressure, BP 126/68, pulse 58 and regular, respirations 16, O2 Sat 95%.  Discussed normal levels for blood pressure and pulse and symptoms to report to her MD since she is taking blood pressure medications.  Past Medical History: Past Medical History:  Diagnosis Date   Diabetes [facility])     Encounter Details:  Community Questionnaire - 08/21/23 1555       Questionnaire   Ask client: Do you give verbal consent for me to treat you today? Yes    Student Assistance N/A    Location Patient Served  N/A    Encounter Setting CN site    Population Status Unknown   Has own apartment at [facility]    Insurance/Financial Assistance Referral N/A    Medication N/A    Medical Provider Yes    Screening Referrals Made N/A    Medical Referrals Made N/A    Medical Appointment Completed N/A    CNP Interventions Advocate/Support;Counsel;Educate    Screenings CN Performed Blood Pressure    ED Visit Averted N/A    Life-Saving Intervention Made N/A

## 2023-09-12 ENCOUNTER — Other Ambulatory Visit

## 2023-09-18 ENCOUNTER — Ambulatory Visit: Payer: Medicaid Other | Admitting: Podiatry

## 2023-09-18 ENCOUNTER — Encounter: Payer: Self-pay | Admitting: Podiatry

## 2023-09-18 DIAGNOSIS — M79674 Pain in right toe(s): Secondary | ICD-10-CM

## 2023-09-18 DIAGNOSIS — M2142 Flat foot [pes planus] (acquired), left foot: Secondary | ICD-10-CM

## 2023-09-18 DIAGNOSIS — E1142 Type 2 diabetes mellitus with diabetic polyneuropathy: Secondary | ICD-10-CM

## 2023-09-18 DIAGNOSIS — M2141 Flat foot [pes planus] (acquired), right foot: Secondary | ICD-10-CM | POA: Diagnosis not present

## 2023-09-18 DIAGNOSIS — M79675 Pain in left toe(s): Secondary | ICD-10-CM

## 2023-09-18 DIAGNOSIS — M2011 Hallux valgus (acquired), right foot: Secondary | ICD-10-CM | POA: Diagnosis not present

## 2023-09-18 DIAGNOSIS — B351 Tinea unguium: Secondary | ICD-10-CM

## 2023-09-18 DIAGNOSIS — E119 Type 2 diabetes mellitus without complications: Secondary | ICD-10-CM | POA: Diagnosis not present

## 2023-09-18 DIAGNOSIS — M2012 Hallux valgus (acquired), left foot: Secondary | ICD-10-CM

## 2023-09-25 NOTE — Progress Notes (Signed)
 ANNUAL DIABETIC FOOT EXAM  Subjective: Kristina [name] presents today for annual diabetic foot exam. She is accompanied by family member on today's visit. She has received her diabetic shoes.  Patient confirms h/o diabetes.  Patient denies any h/o foot wounds.  Patient has been diagnosed with neuropathy.  Kristina Amour, MD is patient's PCP.  Past Medical History:  Diagnosis Date   Diabetes [facility])    Patient Active Problem List   Diagnosis Date Noted   Diabetes (HCC) 09/05/2022   History reviewed. No pertinent surgical history. Current Outpatient Medications on File Prior to Visit  Medication Sig Dispense Refill   atorvastatin (LIPITOR) 10 MG tablet Take 10 mg by mouth daily.     benazepril (LOTENSIN) 40 MG tablet Take 40 mg by mouth daily.     fluticasone (FLONASE) 50 MCG/ACT nasal spray Place 1 spray into both nostrils daily.     hydrochlorothiazide (HYDRODIURIL) 25 MG tablet Take 25 mg by mouth daily.     levothyroxine (SYNTHROID) 75 MCG tablet Take 75 mcg by mouth daily.     metFORMIN (GLUCOPHAGE-XR) 500 MG 24 hr tablet Take 1,500 mg by mouth daily.     metoprolol succinate (TOPROL-XL) 25 MG 24 hr tablet Take 25 mg by mouth daily.     nicotine polacrilex (NICORETTE) 2 MG gum Take 2 mg by mouth as needed.     simvastatin (ZOCOR) 10 MG tablet Take 10 mg by mouth at bedtime.     No current facility-administered medications on file prior to visit.    No Known Allergies Social History   Occupational History   Not on file  Tobacco Use   Smoking status: Every Day    Current packs/day: 0.50    Types: Cigarettes   Smokeless tobacco: Former    Types: Snuff  Substance and Sexual Activity   Alcohol use: Yes    Comment: once in a while   Drug use: Never   Sexual activity: Not on file   History reviewed. No pertinent family history.  There is no immunization history on file for this patient.   Review of Systems: Negative except as noted in the HPI.    Objective: There were no vitals filed for this visit.  Kristina [name] is a pleasant 63 y.o. female in NAD. AAO X 3.  Diabetic foot exam was performed with the following findings:   Vascular Examination: Capillary refill time immediate b/l. Vascular status intact b/l with palpable pedal pulses. Pedal hair present b/l. No pain with calf compression b/l. Skin temperature gradient WNL b/l. No cyanosis or clubbing b/l. No ischemia or gangrene noted b/l. No edema noted b/l LE.  Neurological Examination: Sensation grossly intact b/l with 10 gram monofilament. Vibratory sensation intact b/l. Protective sensation decreased with 10 gram monofilament b/l.  Dermatological Examination: Pedal skin with normal turgor, texture and tone b/l.  No open wounds. No interdigital macerations.   Toenails 1-5 b/l thick, discolored, elongated with subungual debris and pain on dorsal palpation.   Minimal hyperkeratos(is/es) noted submet head 1 b/l.  Musculoskeletal Examination: Muscle strength 5/5 to all lower extremity muscle groups bilaterally. HAV with bunion deformity noted b/l LE. Pes planus deformity noted bilateral LE.  Radiographs: None   ADA Risk Categorization: Low Risk :  Patient has all of the following: Intact protective sensation No prior foot ulcer  No severe deformity Pedal pulses present  Assessment: 1. Pain due to onychomycosis of toenails of both feet   2. Pes planus of  both feet   3. Hallux valgus, acquired, bilateral   4. Diabetic peripheral neuropathy associated with type 2 diabetes mellitus (HCC)   5. Encounter for diabetic foot exam (HCC)    Plan: Diabetic foot examination performed today. All patient's and/or POA's questions/concerns addressed on today's visit. Toenails 1-5 debrided in length and girth without incident. Continue foot and shoe inspections daily. Monitor blood glucose per PCP/Endocrinologist's recommendations. Continue soft, supportive shoe gear daily.  Report any pedal injuries to medical professional. Call office if there are any questions/concerns. -Patient/POA to call should there be question/concern in the interim. Return in about 3 months (around 12/19/2023).  Kristina [name], DPM      [place] LOCATION: [address], [place] [address]                   Office [phone]   [facility] LOCATION: [address] [place], [place] [address] Office [phone]

## 2023-11-05 ENCOUNTER — Other Ambulatory Visit: Payer: Self-pay | Admitting: Family Medicine

## 2023-11-05 DIAGNOSIS — Z1231 Encounter for screening mammogram for malignant neoplasm of breast: Secondary | ICD-10-CM

## 2023-11-20 ENCOUNTER — Ambulatory Visit
Admission: RE | Admit: 2023-11-20 | Discharge: 2023-11-20 | Disposition: A | Discharge status: Home / Self Care | Source: Ambulatory Visit | Attending: Family Medicine | Admitting: Family Medicine

## 2023-11-20 DIAGNOSIS — Z1231 Encounter for screening mammogram for malignant neoplasm of breast: Secondary | ICD-10-CM | POA: Diagnosis present

## 2023-12-18 LAB — GLUCOSE, POCT (MANUAL RESULT ENTRY): POC Glucose: 195 mg/dL — AB (ref 70–99)

## 2023-12-18 NOTE — Congregational Nurse Program (Signed)
  Dept: [phone]   Congregational Nurse Program Note  Date of Encounter: 12/18/2023  Resident clinic visit to check blood pressure and blood glucose, states she had come from a long walk outside. BP 130/64, pulse 66 ans regular.  Blood glucose 195 pc lunch, states she is taking Metformin daily, unsure of carbohydrate intake, reviewed food choices to better manage blood glucose. Past Medical History: Past Medical History:  Diagnosis Date   Diabetes [facility])     Encounter Details:  Community Questionnaire - 12/18/23 1510       Questionnaire   Ask client: Do you give verbal consent for me to treat you today? Yes    Student Assistance N/A    Location Patient Served  N/A    Encounter Setting CN site    Population Status Unknown   Has own apartment at [facility]    Insurance/Financial Assistance Referral N/A    Medication N/A    Medical Provider Yes    Screening Referrals Made N/A    Medical Referrals Made N/A    Medical Appointment Completed N/A    CNP Interventions Advocate/Support;Counsel;Educate    Screenings CN Performed Blood Pressure;Blood Glucose    ED Visit Averted N/A    Life-Saving Intervention Made N/A

## 2023-12-25 ENCOUNTER — Ambulatory Visit: Admitting: Podiatry

## 2023-12-25 ENCOUNTER — Encounter: Payer: Self-pay | Admitting: Podiatry

## 2023-12-25 DIAGNOSIS — E1142 Type 2 diabetes mellitus with diabetic polyneuropathy: Secondary | ICD-10-CM | POA: Diagnosis not present

## 2023-12-25 DIAGNOSIS — M79674 Pain in right toe(s): Secondary | ICD-10-CM

## 2023-12-25 DIAGNOSIS — M79675 Pain in left toe(s): Secondary | ICD-10-CM | POA: Diagnosis not present

## 2023-12-25 DIAGNOSIS — B351 Tinea unguium: Secondary | ICD-10-CM | POA: Diagnosis not present

## 2023-12-28 NOTE — Progress Notes (Signed)
  Subjective:  Patient ID: Kristina [name], female    DOB: 04/08/1961,  MRN: [id number]  Kristina [name] presents to clinic today for at risk foot care with history of diabetic neuropathy and callus(es) of both feet and painful mycotic toenails that are difficult to trim. Painful toenails interfere with ambulation. Aggravating factors include wearing enclosed shoe gear. Pain is relieved with periodic professional debridement. Painful calluses are aggravated when weightbearing with and without shoegear. Pain is relieved with periodic professional debridement.  Chief Complaint  Patient presents with   [facility]    Rm2 Diabetic foot care Dr. Debby Salts last visit/ A1c 7   PCP is Wroth, Debby DEL, MD.  No Known Allergies  Review of Systems: Negative except as noted in the HPI.  Objective: No changes noted in today's physical examination. There were no vitals filed for this visit. Kristina [name] is a pleasant 63 y.o. female in NAD. AAO x 3.  Vascular Examination: Capillary refill time immediate b/l. Vascular status intact b/l with palpable pedal pulses. Pedal hair present b/l. No pain with calf compression b/l. Skin temperature gradient WNL b/l. No cyanosis or clubbing b/l. No ischemia or gangrene noted b/l. No edema noted b/l LE.  Neurological Examination: Vibratory sensation intact b/l. Protective sensation decreased with 10 gram monofilament b/l.  Dermatological Examination: Pedal skin with normal turgor, texture and tone b/l.  No open wounds. No interdigital macerations.   Toenails 1-5 b/l thick, discolored, elongated with subungual debris and pain on dorsal palpation.   Hyperkeratotic lesion noted submet head 1 b/l.  Musculoskeletal Examination: Muscle strength 5/5 to all lower extremity muscle groups bilaterally. HAV with bunion deformity noted b/l LE. Pes planus deformity noted bilateral LE.  Radiographs: None  Assessment/Plan: 1. Pain due to onychomycosis of toenails  of both feet   2. Diabetic peripheral neuropathy associated with type 2 diabetes mellitus [facility])   Consent given for treatment. Patient examined. All patient's and/or POA's questions/concerns addressed on today's visit. Mycotic toenails 1-5 debrided in length and girth without incident. As a courtesy, callus(es) 1st metatarsal head b/l lower extremities pared with sharp debridement without incident. Continue foot and shoe inspections daily. Monitor blood glucose per PCP/Endocrinologist's recommendations.Continue soft, supportive shoe gear daily. Report any pedal injuries to medical professional. Call office if there are any quesitons/concerns. Return in about 3 months (around 03/25/2024).  [name], DPM      [place] LOCATION: [address] [place] [address]                   Office [phone]   [facility] LOCATION: [address] [place], [place] [address] Office [phone]

## 2023-12-29 ENCOUNTER — Ambulatory Visit: Admitting: Podiatry

## 2023-12-29 ENCOUNTER — Ambulatory Visit (INDEPENDENT_AMBULATORY_CARE_PROVIDER_SITE_OTHER)

## 2023-12-29 DIAGNOSIS — M19072 Primary osteoarthritis, left ankle and foot: Secondary | ICD-10-CM

## 2023-12-29 DIAGNOSIS — E1142 Type 2 diabetes mellitus with diabetic polyneuropathy: Secondary | ICD-10-CM

## 2023-12-29 DIAGNOSIS — M19071 Primary osteoarthritis, right ankle and foot: Secondary | ICD-10-CM

## 2023-12-29 DIAGNOSIS — M79671 Pain in right foot: Secondary | ICD-10-CM

## 2023-12-29 MED ORDER — LIDOCAINE 5 % EX PTCH: 1.0000 | MEDICATED_PATCH | CUTANEOUS | 0 refills | Status: AC

## 2024-01-01 NOTE — Progress Notes (Signed)
 Subjective:  Patient ID: Kristina [name], female    DOB: 01-24-1961,  MRN: [id number]  Chief Complaint  Patient presents with   Foot Pain    Top of foot on the left-pain. Right foot pain near the ankle.    Discussed the use of AI scribe software for clinical note transcription with the patient, who gave verbal consent to proceed.  History of Present Illness Kristina [name] is a 63 year old female with diabetes who presents with tingling in her feet.  She experiences tingling in her feet, particularly noticeable at night when she is in bed. This sensation has been present for a long time and is described as occurring in specific areas of her feet. It can also be painful when pressure is applied to certain spots. The tingling and pain also occur during the daytime when walking.  She has a history of diabetes, with a recent A1c level of approximately 7.1, checked two weeks ago. She is currently taking metformin for diabetes management. There is no mention of any other medications being used for her foot symptoms.  She had spinal meningitis at 11 months old and reports that her feet have always been tight since then. She also reports arthritis in her foot, which she associates with pain in specific areas. However, she notes that the tingling and burning sensations are distinct from the arthritis pain.      Objective:    Physical Exam VASCULAR: DP and PT pulse palpable. Foot is warm and well-perfused. Capillary fill time is brisk. DERMATOLOGIC: Normal skin turgor, texture, and temperature. No open lesions, rashes, or ulcerations. NEUROLOGIC: Normal sensation to light touch and pressure. No paresthesias. ORTHOPEDIC: Smooth, pain-free range of motion of all examined joints. No ecchymosis, bruising, or gross deformity. No pain to palpation. Foot examined, no abnormal findings.   No images are attached to the encounter.    Results LABS A1c: 7.1 (12/15/2023)  RADIOLOGY Foot  X-ray: Degenerative changes consistent with arthritis in various regions of the foot   Assessment:   1. Arthritis of both feet   2. Diabetic peripheral neuropathy associated with type 2 diabetes mellitus (HCC)      Plan:  Patient was evaluated and treated and all questions answered.  Assessment and Plan Assessment & Plan Right foot neuropathic pain Chronic tingling and pain in the right foot, primarily at night, suggestive of neuropathic pain. Possible etiologies include diabetic neuropathy, age-related nerve damage, or sequelae from past spinal issues. No current use of medications like gabapentin or Lyrica due to potential side effects such as dizziness, sleepiness, and balance problems. Discussion of non-narcotic treatment options and potential interventions by pain specialists. Consideration of a lidocaine  patch for symptomatic relief at night, with caution regarding adhesive allergies. - Refer to Dr. Marcelino at [facility] for evaluation and management of neuropathic pain. - Prescribe 5% lidocaine  patch for nighttime use, with instructions to apply for no more than 12 hours and remove in the morning.  Right foot osteoarthritis Presence of osteoarthritis in the right foot confirmed by x-ray. Pain not primarily attributed to osteoarthritis as it typically worsens with activity and does not cause tingling or burning sensations. Current symptoms more consistent with neuropathic pain.  Type 2 diabetes mellitus Type 2 diabetes mellitus with recent A1c of approximately 7.1. Diabetes management is crucial as it may contribute to neuropathic symptoms. Currently managed with metformin. Concerns about potential kidney impact from additional medications.      Return if symptoms worsen or  fail to improve.

## 2024-02-15 LAB — COLOGUARD

## 2024-03-25 ENCOUNTER — Ambulatory Visit: Admitting: Podiatry

## 2024-03-25 ENCOUNTER — Encounter: Payer: Self-pay | Admitting: Podiatry

## 2024-03-25 DIAGNOSIS — L84 Corns and callosities: Secondary | ICD-10-CM | POA: Diagnosis not present

## 2024-03-25 DIAGNOSIS — B351 Tinea unguium: Secondary | ICD-10-CM

## 2024-03-25 DIAGNOSIS — M79675 Pain in left toe(s): Secondary | ICD-10-CM | POA: Diagnosis not present

## 2024-03-25 DIAGNOSIS — E1142 Type 2 diabetes mellitus with diabetic polyneuropathy: Secondary | ICD-10-CM | POA: Diagnosis not present

## 2024-03-25 DIAGNOSIS — M79674 Pain in right toe(s): Secondary | ICD-10-CM | POA: Diagnosis not present

## 2024-03-25 NOTE — Progress Notes (Unsigned)
°  Subjective:  Patient ID: Kristina [name], female    DOB: January 31, 1961,  MRN: [id number]  Kenyon Eshleman presents to clinic today for {jgcomplaint:23593}  Chief Complaint  Patient presents with   Nail Problem    Thick painful toenails, 3 month follow up    Diabetes   New problem(s): None. {jgcomplaint:23593}  PCP is Sameul Debby DEL, MD.  Allergies[1]  Review of Systems: Negative except as noted in the HPI.  Objective: No changes noted in today's physical examination. There were no vitals filed for this visit. Kristina [name] is a pleasant 63 y.o. female {jgbodyhabitus:24098} AAO x 3.  Vascular Examination: Capillary refill time immediate b/l. Vascular status intact b/l with palpable pedal pulses. Pedal hair present b/l. No pain with calf compression b/l. Skin temperature gradient WNL b/l. No cyanosis or clubbing b/l. No ischemia or gangrene noted b/l. No edema noted b/l LE.  Neurological Examination: Vibratory sensation intact b/l. Protective sensation decreased with 10 gram monofilament b/l.  Dermatological Examination: Pedal skin with normal turgor, texture and tone b/l.  No open wounds. No interdigital macerations.   Toenails 1-5 b/l thick, discolored, elongated with subungual debris and pain on dorsal palpation.   Hyperkeratotic lesion noted submet head 1 b/l.  Musculoskeletal Examination: Muscle strength 5/5 to all lower extremity muscle groups bilaterally. HAV with bunion deformity noted b/l LE. Pes planus deformity noted bilateral LE.  Radiographs: None  Assessment/Plan: 1. Pain due to onychomycosis of toenails of both feet   2. Pre-ulcerative calluses   3. Diabetic peripheral neuropathy associated with type 2 diabetes mellitus (HCC)     No orders of the defined types were placed in this encounter.   None {Jgplan:23602::-Patient/POA to call should there be question/concern in the interim.}   Return in about 3 months (around  06/23/2024).  [name], DPM      [place] LOCATION: [address], [place] [address]                   Office [phone]   [facility] LOCATION: [address] [place], [place] [address] Office [phone]     [1] No Known Allergies

## 2024-04-02 DIAGNOSIS — L84 Corns and callosities: Secondary | ICD-10-CM | POA: Insufficient documentation

## 2024-04-02 DIAGNOSIS — E1142 Type 2 diabetes mellitus with diabetic polyneuropathy: Secondary | ICD-10-CM | POA: Insufficient documentation

## 2024-04-02 DIAGNOSIS — B351 Tinea unguium: Secondary | ICD-10-CM | POA: Insufficient documentation

## 2024-06-24 ENCOUNTER — Ambulatory Visit: Admitting: Podiatry
